# Patient Record
Sex: Female | Born: 1975 | Race: White | Hispanic: No | Marital: Single | Smoking: Never smoker
Health system: Southern US, Community
[De-identification: ages and names within clinical notes are randomized; demographics above are authoritative.]

## PROBLEM LIST (undated history)

## (undated) DIAGNOSIS — F419 Anxiety disorder, unspecified: Secondary | ICD-10-CM

## (undated) HISTORY — DX: Anxiety disorder, unspecified: F41.9

---
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
## 2013-08-17 ENCOUNTER — Ambulatory Visit: Payer: Self-pay | Admitting: Obstetrics and Gynecology

## 2014-08-31 ENCOUNTER — Encounter: Payer: 59 | Admitting: Internal Medicine

## 2014-08-31 NOTE — Progress Notes (Signed)
Pre visit review using our clinic review tool, if applicable. No additional management support is needed unless otherwise documented below in the visit note. 

## 2014-09-19 ENCOUNTER — Ambulatory Visit (INDEPENDENT_AMBULATORY_CARE_PROVIDER_SITE_OTHER): Payer: 59 | Admitting: Psychology

## 2014-09-19 DIAGNOSIS — F332 Major depressive disorder, recurrent severe without psychotic features: Secondary | ICD-10-CM | POA: Diagnosis not present

## 2014-10-10 ENCOUNTER — Ambulatory Visit: Payer: 59 | Admitting: Psychology

## 2016-08-18 ENCOUNTER — Other Ambulatory Visit: Payer: Self-pay | Admitting: Internal Medicine

## 2016-08-18 DIAGNOSIS — Z1231 Encounter for screening mammogram for malignant neoplasm of breast: Secondary | ICD-10-CM

## 2016-09-11 ENCOUNTER — Ambulatory Visit
Admission: RE | Admit: 2016-09-11 | Discharge: 2016-09-11 | Disposition: A | Discharge status: Home / Self Care | Payer: 59 | Source: Ambulatory Visit | Attending: Internal Medicine | Admitting: Internal Medicine

## 2016-09-11 ENCOUNTER — Encounter: Payer: Self-pay | Admitting: Radiology

## 2016-09-11 DIAGNOSIS — Z1231 Encounter for screening mammogram for malignant neoplasm of breast: Secondary | ICD-10-CM | POA: Insufficient documentation

## 2017-07-01 ENCOUNTER — Other Ambulatory Visit: Payer: Self-pay | Admitting: Internal Medicine

## 2017-07-01 DIAGNOSIS — R319 Hematuria, unspecified: Secondary | ICD-10-CM

## 2017-07-03 ENCOUNTER — Ambulatory Visit
Admission: RE | Admit: 2017-07-03 | Discharge: 2017-07-03 | Disposition: A | Discharge status: Home / Self Care | Payer: Managed Care, Other (non HMO) | Source: Ambulatory Visit | Attending: Internal Medicine | Admitting: Internal Medicine

## 2017-07-03 DIAGNOSIS — R319 Hematuria, unspecified: Secondary | ICD-10-CM | POA: Diagnosis present

## 2017-07-09 ENCOUNTER — Other Ambulatory Visit: Payer: Self-pay

## 2017-07-30 ENCOUNTER — Ambulatory Visit: Payer: Managed Care, Other (non HMO) | Admitting: Urology

## 2017-07-30 ENCOUNTER — Encounter: Payer: Self-pay | Admitting: Urology

## 2017-07-30 VITALS — BP 104/64 | HR 77 | Ht 64.0 in | Wt 135.0 lb

## 2017-07-30 DIAGNOSIS — R350 Frequency of micturition: Secondary | ICD-10-CM | POA: Diagnosis not present

## 2017-07-30 DIAGNOSIS — R3129 Other microscopic hematuria: Secondary | ICD-10-CM

## 2017-07-30 LAB — BLADDER SCAN AMB NON-IMAGING

## 2017-07-30 LAB — URINALYSIS, COMPLETE
Bilirubin, UA: NEGATIVE
Glucose, UA: NEGATIVE
Ketones, UA: NEGATIVE
Nitrite, UA: NEGATIVE
PH UA: 7.5 (ref 5.0–7.5)
Protein, UA: NEGATIVE
Specific Gravity, UA: 1.015 (ref 1.005–1.030)
UUROB: 0.2 mg/dL (ref 0.2–1.0)

## 2017-07-30 LAB — MICROSCOPIC EXAMINATION

## 2017-07-30 MED ORDER — SULFAMETHOXAZOLE-TRIMETHOPRIM 800-160 MG PO TABS: 1.0000 | ORAL_TABLET | Freq: Every day | ORAL | 0 refills | Status: DC

## 2017-07-30 NOTE — Progress Notes (Signed)
07/30/2017 2:06 PM   Wendy ParisAlyson N Hatfield February 22, 1976 [id number]  Referring provider: Marguarite ArbourSparks, Jeffrey D, MD [address] [place], [place] [id number]  Chief Complaint  Patient presents with  . Hematuria    New Patient    HPI: Wendy Hatfield is a 42 year old female seen in consultation at the request of Dr. Judithann SheenSparks for evaluation of pyuria and irritative voiding symptoms.  She states on 06/23/2017 she had acute onset of severe urinary urgency, frequency and gross hematuria.  She had an electronic visit and was prescribed Pyridium and a 5-day course of Macrobid.  Her symptoms improved however recurred 5 days after completing her antibiotics.  She saw Dr. Letitia LibraJohnston on 07/01/2017.  Urinalysis showed 10-50 WBC/10-50 RBCs.  A urine culture was not ordered.  A CT scan was ordered however it was unfortunately only a noncontrast CT of the abdomen and pelvis.  This did not show any abnormalities.  She was again treated with a second course of antibiotics with improvement in her symptoms.  Her frequency, urgency again recurred.  She saw Dr. Judithann SheenSparks on 07/14/2017.  Her urinalysis showed 10-50 WBCs with no significant RBCs.  A urine culture was ordered which had no significant growth within the past few days she has had recurrent mild frequency and urgency.  She was treated with a 10-day course of Cipro with resolution of her symptoms.  Prior to onset of the symptoms in early January she denied recurrent urinary tract infections, hematuria or flank or abdominal pain.  Her hematuria has not recurred since the initial episode.  She denies fever, chills.  She denies flank or abdominal pain.  PMH: Past Medical History:  Diagnosis Date  . Anxiety     Surgical History: Past Surgical History:  Procedure Laterality Date  . APPENDECTOMY  1986    Home Medications:  Allergies as of 07/30/2017   No Known Allergies     Medication List        Accurate as of 07/30/17  2:06 PM. Always use your most recent med  list.          ALPRAZolam 0.25 MG tablet Commonly known as:  XANAX alprazolam 0.25 mg tablet   amphetamine-dextroamphetamine 10 MG tablet Commonly known as:  ADDERALL dextroamphetamine-amphetamine 10 mg tablet  Take 1 tablet every day by oral route.   PRENATAL 1 PO Prenatal       Allergies: No Known Allergies  Family History: Family History  Problem Relation Age of Onset  . Bladder Cancer Father     Social History:  reports that  has never smoked. she has never used smokeless tobacco. She reports that she does not drink alcohol or use drugs.  ROS: UROLOGY Frequent Urination?: Yes Hard to postpone urination?: Yes Burning/pain with urination?: Yes Get up at night to urinate?: Yes Leakage of urine?: Yes Urine stream starts and stops?: No Trouble starting stream?: No Do you have to strain to urinate?: No Blood in urine?: Yes Urinary tract infection?: No Sexually transmitted disease?: No Injury to kidneys or bladder?: No Painful intercourse?: No Weak stream?: No Currently pregnant?: No Vaginal bleeding?: No Last menstrual period?: n  Gastrointestinal Nausea?: No Vomiting?: No Indigestion/heartburn?: No Diarrhea?: No Constipation?: No  Constitutional Fever: No Night sweats?: No Weight loss?: No Fatigue?: No  Skin Skin rash/lesions?: No Itching?: No  Eyes Blurred vision?: No Double vision?: No  Ears/Nose/Throat Sore throat?: No Sinus problems?: No  Hematologic/Lymphatic Swollen glands?: No Easy bruising?: No  Cardiovascular Leg swelling?: No Chest pain?:  No  Respiratory Cough?: No Shortness of breath?: No  Endocrine Excessive thirst?: No  Musculoskeletal Back pain?: No Joint pain?: No  Neurological Headaches?: No Dizziness?: No  Psychologic Depression?: No Anxiety?: No  Physical Exam: BP 104/64   Pulse 77   Ht 5\' 4"  (1.626 m)   Wt 135 lb (61.2 kg)   BMI 23.17 kg/m   Constitutional:  Alert and oriented, No acute  distress. HEENT: [place] AT, moist mucus membranes.  Trachea midline, no masses. Cardiovascular: No clubbing, cyanosis, or edema. Respiratory: Normal respiratory effort, no increased work of breathing. GI: Abdomen is soft, nontender, nondistended, no abdominal masses GU: No CVA tenderness. Skin: No rashes, bruises or suspicious lesions. Lymph: No cervical or inguinal adenopathy. Neurologic: Grossly intact, no focal deficits, moving all 4 extremities. Psychiatric: Normal mood and affect.  Laboratory Data:  Urinalysis Microscopy- 6-10 WBC/3-10 RBC/moderate bacteria  Pertinent Imaging: CT images were reviewed   Assessment & Plan:  42 year old female with persistent irritative voiding symptoms which improve with antibiotics.  Persistent pyuria with negative culture.  Urine culture was repeated today.  Will go ahead and start antibiotics pending the culture result.  Have recommended scheduling cystoscopy for further evaluation.  PVR by bladder scan was 10 mL.  1. Microscopic hematuria  - Urinalysis, Complete - BLADDER SCAN AMB NON-IMAGING - CULTURE, URINE COMPREHENSIVE  2. Urinary frequency  - Urinalysis, Complete - BLADDER SCAN AMB NON-IMAGING   Return in about 4 weeks (around 08/27/2017) for Cystoscopy.    Riki Altes, MD  [facility] [address] [place], [place] [address] [phone]

## 2017-08-04 LAB — CULTURE, URINE COMPREHENSIVE

## 2017-08-05 ENCOUNTER — Telehealth: Payer: Self-pay

## 2017-08-05 ENCOUNTER — Encounter (INDEPENDENT_AMBULATORY_CARE_PROVIDER_SITE_OTHER): Payer: Self-pay

## 2017-08-05 NOTE — Telephone Encounter (Signed)
Letter sent.

## 2017-08-05 NOTE — Telephone Encounter (Signed)
-----   Message from Scott C Stoioff, MD sent at 08/05/2017  9:10 AM EST ----- Urine culture was negative for infection 

## 2017-08-27 ENCOUNTER — Ambulatory Visit (INDEPENDENT_AMBULATORY_CARE_PROVIDER_SITE_OTHER): Payer: Managed Care, Other (non HMO) | Admitting: Urology

## 2017-08-27 ENCOUNTER — Encounter: Payer: Self-pay | Admitting: Urology

## 2017-08-27 VITALS — BP 97/60 | HR 59 | Ht 64.0 in | Wt 135.0 lb

## 2017-08-27 DIAGNOSIS — R3129 Other microscopic hematuria: Secondary | ICD-10-CM | POA: Diagnosis not present

## 2017-08-27 MED ORDER — CIPROFLOXACIN HCL 500 MG PO TABS: 500.0000 mg | ORAL_TABLET | Freq: Once | ORAL | Status: AC

## 2017-08-27 MED ORDER — LIDOCAINE HCL 2 % EX GEL: 1.0000 "application " | Freq: Once | CUTANEOUS | Status: AC

## 2017-08-27 NOTE — Progress Notes (Signed)
   08/27/17  CC:  Chief Complaint  Patient presents with  . Cysto    HPI: See note 07/30/2017  Blood pressure 97/60, pulse (!) 59, height 5\' 4"  (1.626 m), weight 135 lb (61.2 kg). NED. A&Ox3.   No respiratory distress   Abd soft, NT, ND Normal external genitalia with patent urethral meatus  Cystoscopy Procedure Note  Patient identification was confirmed, informed consent was obtained, and patient was prepped using Betadine solution.  Lidocaine jelly was administered per urethral meatus.    Preoperative abx where received prior to procedure.    Procedure: - Flexible cystoscope introduced, without any difficulty.   - Thorough search of the bladder revealed:    normal urethral meatus    normal urothelium    no stones    no ulcers     no tumors    no urethral polyps    no trabeculation  - Ureteral orifices were normal in position and appearance.  Post-Procedure: - Patient tolerated the procedure well  Assessment/ Plan: No abnormalities noted on cystoscopy. Urinalysis today was normal.  Her symptoms are improving.  Follow-up 4-6 weeks for symptom reassessment and repeat UA A.   Riki AltesScott C Tamura Lasky, MD

## 2017-08-28 LAB — URINALYSIS, COMPLETE
Bilirubin, UA: NEGATIVE
GLUCOSE, UA: NEGATIVE
Ketones, UA: NEGATIVE
Leukocytes, UA: NEGATIVE
NITRITE UA: NEGATIVE
PH UA: 6 (ref 5.0–7.5)
Protein, UA: NEGATIVE
Specific Gravity, UA: 1.01 (ref 1.005–1.030)
UUROB: 0.2 mg/dL (ref 0.2–1.0)

## 2017-08-28 LAB — MICROSCOPIC EXAMINATION
BACTERIA UA: NONE SEEN
EPITHELIAL CELLS (NON RENAL): NONE SEEN /HPF (ref 0–10)

## 2017-10-01 ENCOUNTER — Encounter: Payer: Self-pay | Admitting: Urology

## 2017-10-01 ENCOUNTER — Ambulatory Visit: Payer: Managed Care, Other (non HMO) | Admitting: Urology

## 2019-02-06 IMAGING — CT CT ABD-PELV W/O CM
3 of 4 series · 10 of 46 positions shown, 17 images · non-contrast
Comparison: None.

CLINICAL DATA: Hematuria

EXAM:
CT ABDOMEN AND PELVIS WITHOUT CONTRAST
TECHNIQUE: Multidetector CT imaging of the abdomen and pelvis was performed
following the standard protocol without IV contrast.

[Series 4: lung bases · axial · 0.65mm/px · z∈[+519,+629]mm · 6 of 32 slices shown, 11 images]
[im 5/32  soft-tissue]
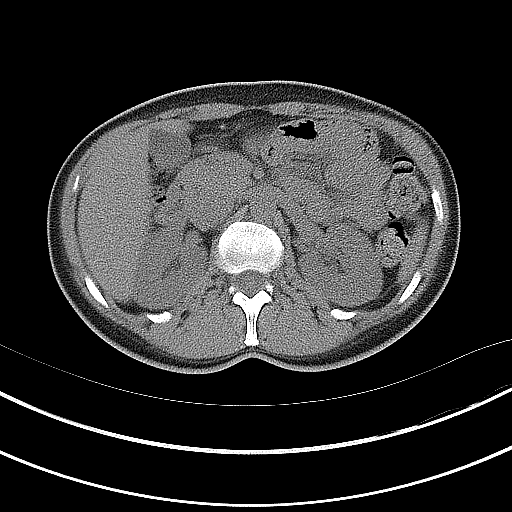
[im 5/32  bone]
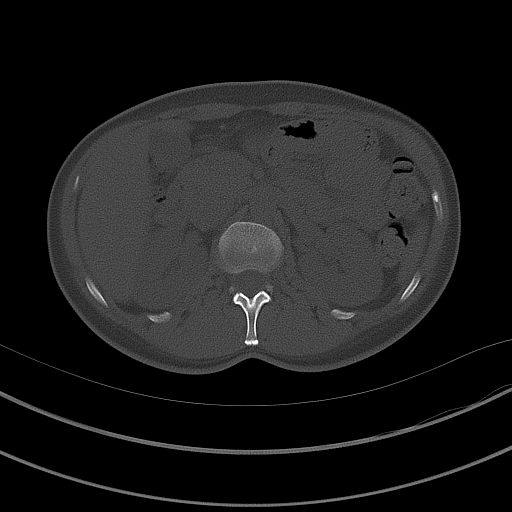
[im 9/32  soft-tissue]
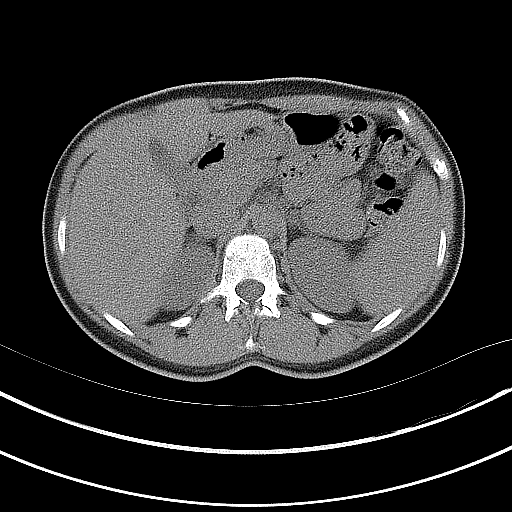
[im 14/32  soft-tissue]
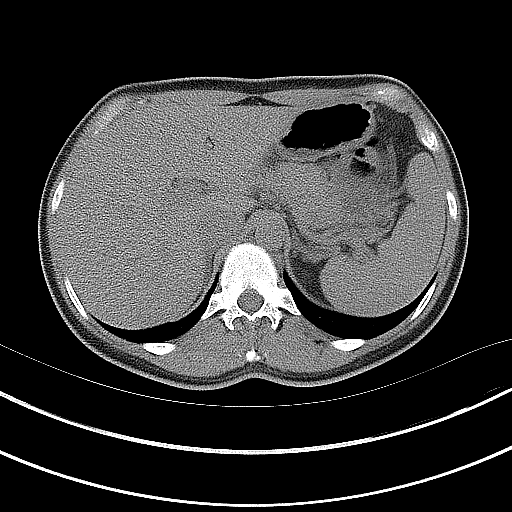
[im 14/32  lung]
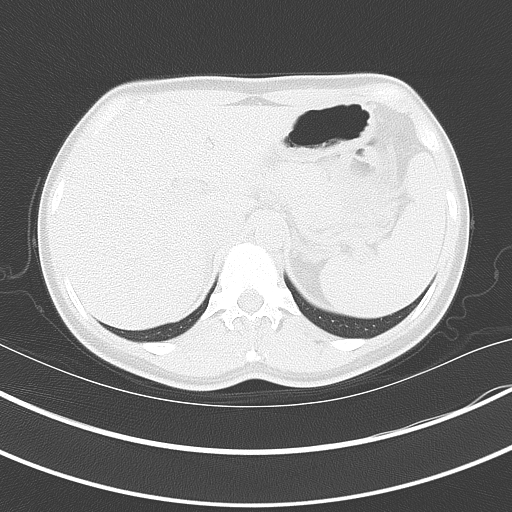
[im 18/32  soft-tissue]
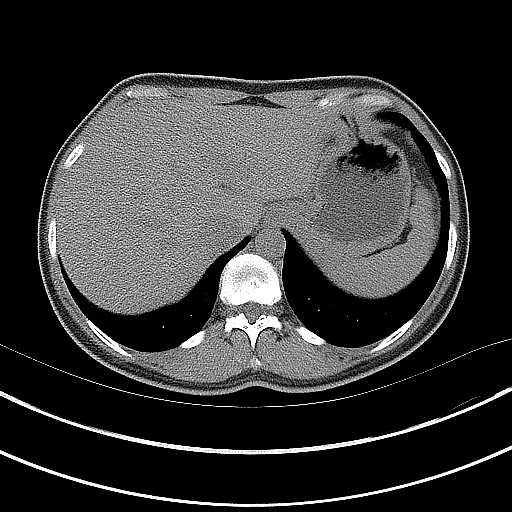
[im 18/32  lung]
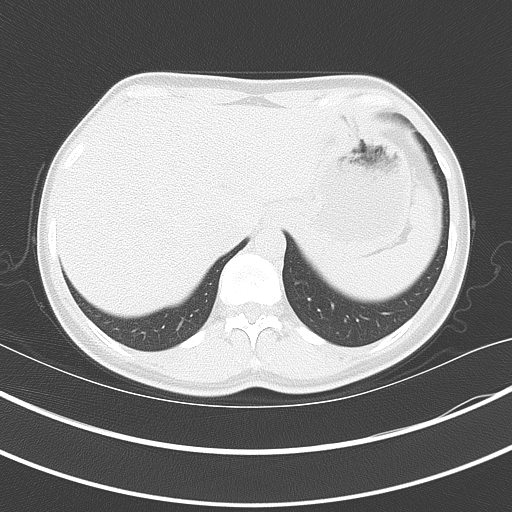
[im 23/32  soft-tissue]
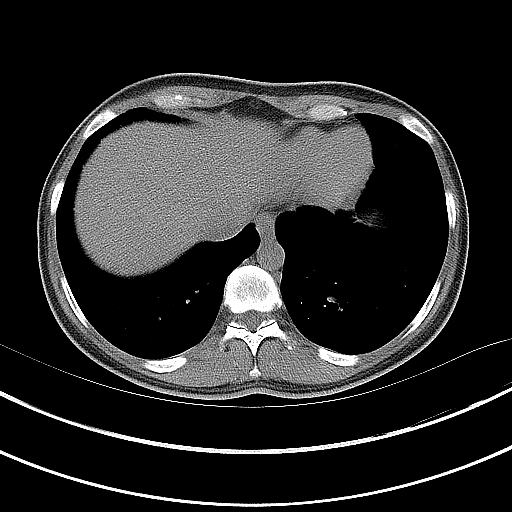
[im 23/32  lung]
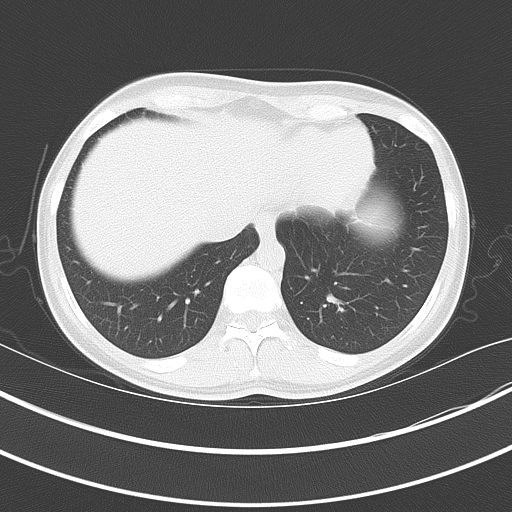
[im 27/32  soft-tissue]
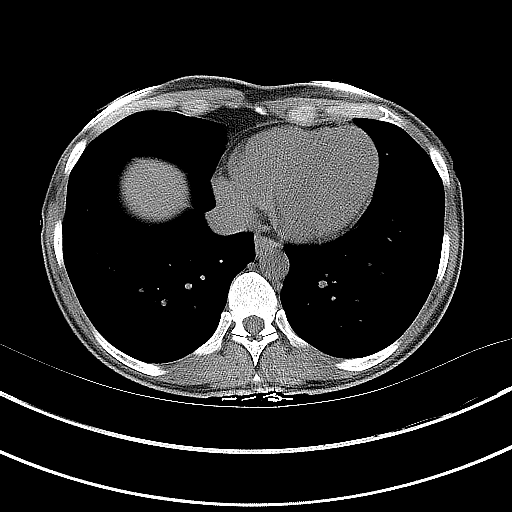
[im 27/32  lung]
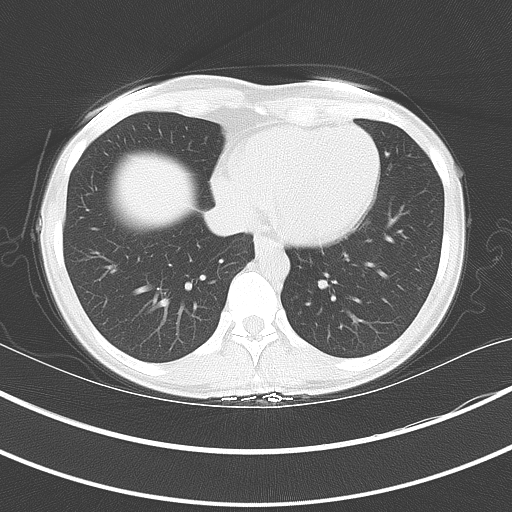

[Series 5: coronal · coronal · 0.75mm/px · 3 of 105 slices shown, 4 images]
[im 35/105  soft-tissue]
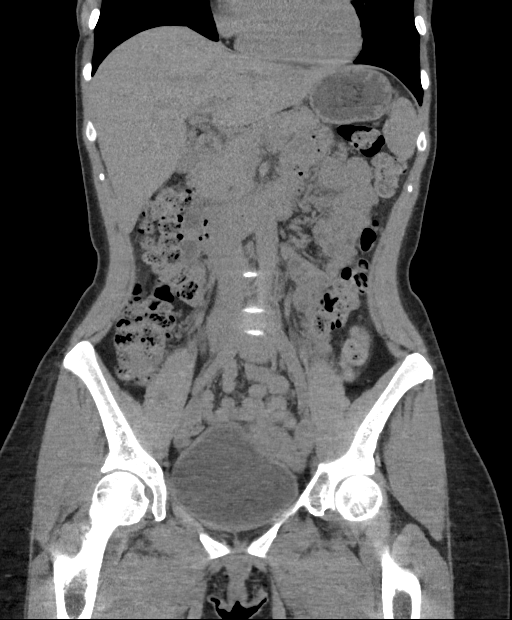
[im 47/105  soft-tissue]
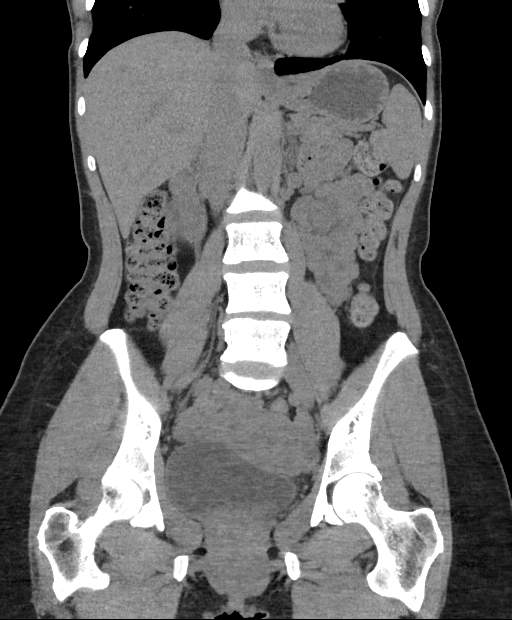
[im 47/105  bone]
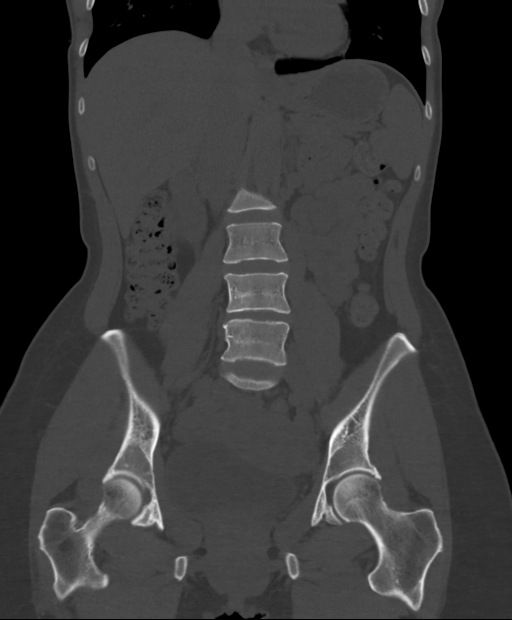
[im 58/105  soft-tissue]
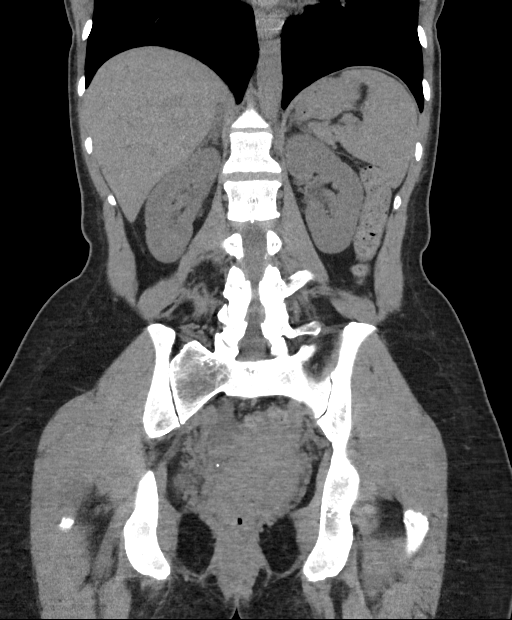

[Series 6: sagittal · sagittal · 0.54mm/px · 1 of 160 slices shown, 2 images]
[im 54/160  soft-tissue]
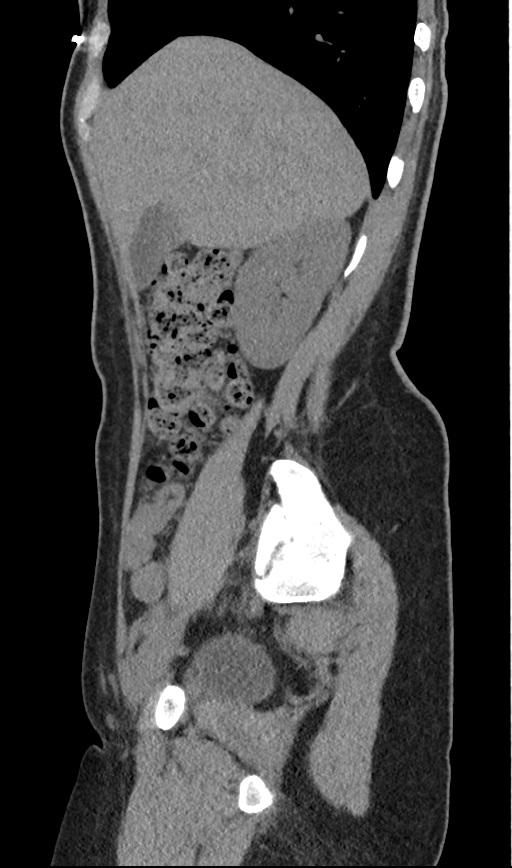
[im 54/160  bone]
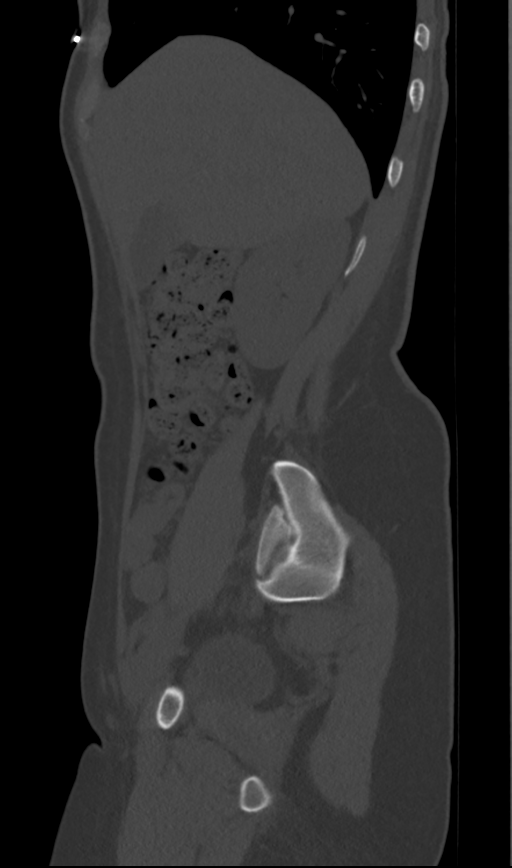

[10 of 46 positions shown; findings below may reference images not displayed]

FINDINGS: Lower chest: Lung bases are free of acute infiltrate or sizable
effusion.

Hepatobiliary: No focal liver abnormality is seen. No gallstones,
gallbladder wall thickening, or biliary dilatation.

Pancreas: Unremarkable. No pancreatic ductal dilatation or
surrounding inflammatory changes.

Spleen: Normal in size without focal abnormality.

Adrenals/Urinary Tract: Adrenal glands are unremarkable. Kidneys are
normal, without renal calculi, focal lesion, or hydronephrosis.
Bladder is unremarkable.

Stomach/Bowel: The appendix is not well visualized. No inflammatory
changes are noted. No obstructive changes are seen.

Vascular/Lymphatic: No significant vascular findings are present. No
enlarged abdominal or pelvic lymph nodes.

Reproductive: Uterus and bilateral adnexa are unremarkable.

Other: No abdominal wall hernia or abnormality. No abdominopelvic
ascites.

Musculoskeletal: No acute or significant osseous findings.
IMPRESSION: No renal calculi or obstructive changes are noted. No acute
abnormality is seen.

## 2022-01-06 ENCOUNTER — Ambulatory Visit: Payer: Managed Care, Other (non HMO) | Admitting: Dermatology

## 2022-01-06 DIAGNOSIS — L650 Telogen effluvium: Secondary | ICD-10-CM

## 2022-01-06 DIAGNOSIS — B360 Pityriasis versicolor: Secondary | ICD-10-CM | POA: Diagnosis not present

## 2022-01-06 MED ORDER — FLUCONAZOLE 200 MG PO TABS: ORAL_TABLET | ORAL | 0 refills | Status: AC

## 2022-01-06 MED ORDER — KETOCONAZOLE 2 % EX SHAM: MEDICATED_SHAMPOO | CUTANEOUS | 3 refills | Status: AC

## 2022-01-06 NOTE — Patient Instructions (Signed)
Due to recent changes in healthcare laws, you may see results of your pathology and/or laboratory studies on MyChart before the doctors have had a chance to review them. We understand that in some cases there may be results that are confusing or concerning to you. Please understand that not all results are received at the same time and often the doctors may need to interpret multiple results in order to provide you with the best plan of care or course of treatment. Therefore, we ask that you please give us 2 business days to thoroughly review all your results before contacting the office for clarification. Should we see a critical lab result, you will be contacted sooner.   If You Need Anything After Your Visit  If you have any questions or concerns for your doctor, please call our main line at [phone] and press option 4 to reach your doctor's medical assistant. If no one answers, please leave a voicemail as directed and we will return your call as soon as possible. Messages left after 4 pm will be answered the following business day.   You may also send us a message via MyChart. We typically respond to MyChart messages within 1-2 business days.  For prescription refills, please ask your pharmacy to contact our office. Our fax number is [phone].  If you have an urgent issue when the clinic is closed that cannot wait until the next business day, you can page your doctor at the number below.    Please note that while we do our best to be available for urgent issues outside of office hours, we are not available 24/7.   If you have an urgent issue and are unable to reach us, you may choose to seek medical care at your doctor's office, retail clinic, urgent care center, or emergency room.  If you have a medical emergency, please immediately call 911 or go to the emergency department.  Pager Numbers  - Dr. Kowalski: [phone]  - Dr. Moye: [phone]  - Dr. Stewart:  [phone]  In the event of inclement weather, please call our main line at [phone] for an update on the status of any delays or closures.  Dermatology Medication Tips: Please keep the boxes that topical medications come in in order to help keep track of the instructions about where and how to use these. Pharmacies typically print the medication instructions only on the boxes and not directly on the medication tubes.   If your medication is too expensive, please contact our office at [phone] option 4 or send us a message through MyChart.   We are unable to tell what your co-pay for medications will be in advance as this is different depending on your insurance coverage. However, we may be able to find a substitute medication at lower cost or fill out paperwork to get insurance to cover a needed medication.   If a prior authorization is required to get your medication covered by your insurance company, please allow us 1-2 business days to complete this process.  Drug prices often vary depending on where the prescription is filled and some pharmacies may offer cheaper prices.  The website www.goodrx.com contains coupons for medications through different pharmacies. The prices here do not account for what the cost may be with help from insurance (it may be cheaper with your insurance), but the website can give you the price if you did not use any insurance.  - You can print the associated coupon and take it with   your prescription to the pharmacy.  - You may also stop by our office during regular business hours and pick up a GoodRx coupon card.  - If you need your prescription sent electronically to a different pharmacy, notify our office through [place] MyChart or by phone at [phone] option 4.     Si Usted Necesita Algo Despus de Su Visita  Tambin puede enviarnos un mensaje a travs de MyChart. Por lo general respondemos a los mensajes de MyChart en el transcurso de 1 a 2  das hbiles.  Para renovar recetas, por favor pida a su farmacia que se ponga en contacto con nuestra oficina. Nuestro nmero de fax es el [phone].  Si tiene un asunto urgente cuando la clnica est cerrada y que no puede esperar hasta el siguiente da hbil, puede llamar/localizar a su doctor(a) al nmero que aparece a continuacin.   Por favor, tenga en cuenta que aunque hacemos todo lo posible para estar disponibles para asuntos urgentes fuera del horario de oficina, no estamos disponibles las 24 horas del da, los 7 das de la semana.   Si tiene un problema urgente y no puede comunicarse con nosotros, puede optar por buscar atencin mdica  en el consultorio de su doctor(a), en una clnica privada, en un centro de atencin urgente o en una sala de emergencias.  Si tiene una emergencia mdica, por favor llame inmediatamente al 911 o vaya a la sala de emergencias.  Nmeros de bper  - Dr. Kowalski: [phone]  - Dra. Moye: [phone]  - Dra. Stewart: [phone]  En caso de inclemencias del tiempo, por favor llame a nuestra lnea principal al [phone] para una actualizacin sobre el estado de cualquier retraso o cierre.  Consejos para la medicacin en dermatologa: Por favor, guarde las cajas en las que vienen los medicamentos de uso tpico para ayudarle a seguir las instrucciones sobre dnde y cmo usarlos. Las farmacias generalmente imprimen las instrucciones del medicamento slo en las cajas y no directamente en los tubos del medicamento.   Si su medicamento es muy caro, por favor, pngase en contacto con nuestra oficina llamando al [phone] y presione la opcin 4 o envenos un mensaje a travs de MyChart.   No podemos decirle cul ser su copago por los medicamentos por adelantado ya que esto es diferente dependiendo de la cobertura de su seguro. Sin embargo, es posible que podamos encontrar un medicamento sustituto a menor costo o llenar un formulario para que el  seguro cubra el medicamento que se considera necesario.   Si se requiere una autorizacin previa para que su compaa de seguros cubra su medicamento, por favor permtanos de 1 a 2 das hbiles para completar este proceso.  Los precios de los medicamentos varan con frecuencia dependiendo del lugar de dnde se surte la receta y alguna farmacias pueden ofrecer precios ms baratos.  El sitio web www.goodrx.com tiene cupones para medicamentos de diferentes farmacias. Los precios aqu no tienen en cuenta lo que podra costar con la ayuda del seguro (puede ser ms barato con su seguro), pero el sitio web puede darle el precio si no utiliz ningn seguro.  - Puede imprimir el cupn correspondiente y llevarlo con su receta a la farmacia.  - Tambin puede pasar por nuestra oficina durante el horario de atencin regular y recoger una tarjeta de cupones de GoodRx.  - Si necesita que su receta se enve electrnicamente a una farmacia diferente, informe a nuestra oficina a travs de MyChart de [place]   o por telfono llamando al [phone] y presione la opcin 4.  

## 2022-01-06 NOTE — Progress Notes (Signed)
New Patient Visit  Subjective  Wendy Hatfield is a 46 y.o. female who presents for the following: Rash  (Ant neck, x 1-2 years - dry scaly patch, uses OTC moisturizers to help improve condition. Fhx of thyroid cancer in sister and PCP was concerned and would like it checked. ), Dry patches (Corner of the mouth - does become discolored at times, brownish in color), Alopecia (Fhx of alopecia areata in sister - significant hair loss over the past couple months, patient losses a lot of hair in the shower, no recent illness but patient has had a lot of stress recently), Tinea versicolor (Chest and back - diagnoses many years ago and was prescribed shampoo and cream but nothing has completely got rid of the rash), and Irregular skin lesion  (On the R lat eyebrow - bleeds if she scratches it. Patient has also noticed bumps on her eyelids that she would like checked today).  The following portions of the chart were reviewed this encounter and updated as appropriate:   Tobacco  Allergies  Meds  Problems  Med Hx  Surg Hx  Fam Hx     Review of Systems:  No other skin or systemic complaints except as noted in HPI or Assessment and Plan.  Objective  Well appearing patient in no apparent distress; mood and affect are within normal limits.  A focused examination was performed including scalp, face, neck. Relevant physical exam findings are noted in the Assessment and Plan.  Scalp Diffuse thinning of hair, positive hair pull test.              Chest, face, back Patches on the chest, back, eyelids, oral commissures   Assessment & Plan  Telogen effluvium Scalp  Telogen effluvium is a benign, self-limited condition causing increased hair shedding usually for several months. It does not progress to baldness, and the hair eventually grows back on its own. It can be triggered by recent illness, recent surgery, thyroid disease, low iron stores, vitamin D deficiency, fad diets or rapid  weight loss, hormonal changes such as pregnancy or birth control pills, and some medication. Usually the hair loss starts 2-3 months after the illness or health change. Rarely, it can continue for longer than a year.  Patient has a lot of stress at work, but no recent illness, or new medications since hair loss began.   Start Biotin 2.5mg  po QD.  Discussed oral Minoxidil but we will need to check patient's labs first to r/o other causes of hair loss.    Tinea versicolor Chest, face, back  (Less likely atopic dermatitis vs contact dermatitis on the face and back) -   Tinea versicolor is a chronic recurrent skin rash causing discolored scaly spots most commonly seen on back, chest, and/or shoulders.  It is generally asymptomatic. The rash is due to overgrowth of a common type of yeast present on everyone's skin and it is not contagious.  It tends to flare more in the summer due to increased sweating on trunk.  After rash is treated, the scaliness will resolve, but the discoloration will take longer to return to normal pigmentation. The periodic use of an OTC medicated soap/shampoo with zinc or selenium sulfide can be helpful to prevent yeast overgrowth and recurrence.  Start Fluconazole 200mg  po QW x 6 wks. Side effects of fluconazole (diflucan) include nausea, diarrhea, headache, dizziness, taste changes, rare risk of irritation of the liver, allergy, or decreased blood counts (which could show up as infection  or tiredness).  Start Ketoconazole 2% shampoo 2-3 days per wk. Let sit 5-10 minutes then wash off.   fluconazole (DIFLUCAN) 200 MG tablet - Chest, face, back Take one tab po QW for 6 weeks. ketoconazole (NIZORAL) 2 % shampoo - Chest, face, back Shampoo onto body let sit 5-10 minutes then wash off. Use 2-3 days per week.  Return for rash follow up in 10-12 wks.  Maylene Roes, CMA, am acting as scribe for Armida Sans, MD . Documentation: I have reviewed the above documentation for  accuracy and completeness, and I agree with the above.  Armida Sans, MD

## 2022-01-15 ENCOUNTER — Encounter: Payer: Self-pay | Admitting: Dermatology

## 2022-01-15 ENCOUNTER — Telehealth: Payer: Self-pay

## 2022-01-15 NOTE — Telephone Encounter (Signed)
Unable to leave a message since her voicemail box is full. If patient wants to start the Minoxidil pill for hair loss we will need screening labs first. I printed the requisition and I can mail it to her or she can come pick it up at our office.

## 2022-01-15 NOTE — Addendum Note (Signed)
Addended by: Cari Caraway A on: 01/15/2022 02:37 PM   Modules accepted: Orders

## 2022-01-20 NOTE — Telephone Encounter (Signed)
Patient states that you were going to discuss starting Minoxidil at follow up appointment. She is scheduled to have labs drawn for her PCP Dr. Judithann Sheen and plans to have that done sometime in the next few weeks.

## 2022-04-07 ENCOUNTER — Ambulatory Visit: Payer: Managed Care, Other (non HMO) | Admitting: Dermatology

## 2023-07-20 ENCOUNTER — Other Ambulatory Visit: Payer: Self-pay | Admitting: Internal Medicine

## 2023-07-20 DIAGNOSIS — Z1231 Encounter for screening mammogram for malignant neoplasm of breast: Secondary | ICD-10-CM

## 2024-03-11 ENCOUNTER — Encounter: Payer: Self-pay | Admitting: Dietician

## 2024-03-11 ENCOUNTER — Encounter: Attending: Obstetrics and Gynecology | Admitting: Dietician

## 2024-03-11 DIAGNOSIS — E663 Overweight: Secondary | ICD-10-CM | POA: Insufficient documentation

## 2024-03-11 DIAGNOSIS — Z713 Dietary counseling and surveillance: Secondary | ICD-10-CM | POA: Diagnosis not present

## 2024-03-11 NOTE — Progress Notes (Signed)
 Medical Nutrition Therapy  Appointment Start time:  1050  Appointment End time:  1220  Primary concerns today: Weight Loss  Referral diagnosis: E66.3 - Overweight Preferred learning style: No preference indicated Learning readiness: Contemplating   NUTRITION ASSESSMENT   Clinical Medical Hx: Overweight Medications: Xanax, Adderall Labs: Reviewed Notable Signs/Symptoms: Unintentional weight gain   Lifestyle & Dietary Hx Pt reports jump in weight over the last 2 years, states they haven't changed their dietary habits, and is unsure as to where this coming from. Pt reports a UBW of 130-135 lbs and is currently around 160 lbs. Pt reports inconsistency with meals, will skip eating some days due to being busy, states they rarely cook at home Pt reports craving sweets more now, eating more convenience foods, snacks a lot during the day/stress eats while working. Pt reports working remote on computer, sedentary during the day    Estimated daily fluid intake: 48 oz Supplements: Vit D Sleep:  Stress / self-care: High, work demand, family demand Current average weekly physical activity: ADLs, walks dog  24-Hr Dietary Recall First Meal: English muffin w/ apple jelly, coffee w/ whole milk Snack:  Second Meal:  Snack:  Third Meal:  Snack:  Beverages: coffee, water   NUTRITION DIAGNOSIS  [place]-3.4 Unintentional weight gain As related to stress.  As evidenced by weight gain of ~30 lbs over UBW in past 2 years, high stress level, emotional eating.   NUTRITION INTERVENTION  Nutrition education (E-1) on the following topics:  Counseled patient on beginning to rebuild their trust in themselves to make the right food choices for their health. Educated patient on mindful eating, including listening to their body's hunger and satiety cues, as well as eating slowly and allowing meals to be more of a sensory experience. Counseled patient on allowing themselves to be present in their emotions when  they consider emotional eating. Advised patient to evaluate whether the impulse to eat is hunger based, or emotionally driven.   Handouts Provided Include  Energy used while exercising  Learning Style & Readiness for Change Teaching method utilized: Visual & Auditory  Demonstrated degree of understanding via: Teach Back  Barriers to learning/adherence to lifestyle change: Stress  Goals Established by Pt Begin to plan on spending the afternoon preparing meals for the week on Sundays. Think about potential obstacles of scheduling that would get in the way of this!  Journal stressful events on days where you find yourself stress eating. Look back at events that happened leading up to when you find yourself stress eating. Bring this log into our next visit!   MONITORING & EVALUATION Dietary intake, weekly physical activity, and stress/work-life balance in 6 weeks.  Next Steps  Patient is to journal stress triggers, follow up with RD.

## 2024-03-11 NOTE — Patient Instructions (Addendum)
 Begin to plan on spending the afternoon preparing meals for the week on Sundays. Think about potential obstacles of scheduling that would get in the way of this!  Journal stressful events on days where you find yourself stress eating. Look back at events that happened leading up to when you find yourself stress eating. Bring this log into our next visit!

## 2024-04-21 ENCOUNTER — Encounter: Attending: Obstetrics and Gynecology | Admitting: Dietician

## 2024-04-21 ENCOUNTER — Encounter: Payer: Self-pay | Admitting: Dietician

## 2024-04-21 DIAGNOSIS — E663 Overweight: Secondary | ICD-10-CM | POA: Insufficient documentation

## 2024-04-21 NOTE — Patient Instructions (Addendum)
 Begin to incorporate resistance exercise 3-5 times per week for 30-60 minutes. Use these exercises to get the most bang for your buck! Squats, Deadlifts, Overhead press, Bench press!  Snack on fruits in place of your chips! Start your day with a small breakfast.

## 2024-04-21 NOTE — Progress Notes (Signed)
 Medical Nutrition Therapy  Appointment Start time:  73  Appointment End time:  1240  Primary concerns today: Weight Loss  Referral diagnosis: E66.3 - Overweight Preferred learning style: No preference indicated Learning readiness: Contemplating   NUTRITION ASSESSMENT   Clinical Medical Hx: Overweight Medications: Alprazolam (PRN) Labs: Reviewed Notable Signs/Symptoms: Unintentional weight gain   Lifestyle & Dietary Hx Pt reports they continue to be very busy, stress level remains high with work, states they have returned to the office 3 days a week now but remains mostly sedentary during work day. Pt reports they have been able think about why they were eating/snacking during the day and attributes it more to boredom than stress.  Pt states they get discouraged with trying to eat healthy/lose weight at times and will eat junkfood out of apathy. Pt reports working on projects at home, states the more organized they get the better that they feel    Estimated daily fluid intake: 48 oz Supplements: Vit D Sleep:  Stress / self-care: High, work demand, family demand Current average weekly physical activity: ADLs, walks dog   24-Hr Dietary Recall First Meal: Cup of coffee w/ whole milk Snack: Salt and vinegar chips, earl grey tea latte Second Meal: Shrimp pasta w/peppers, parmesean Snack: Earl grey tea latte Third Meal:  Snack:  Beverages: coffee, water   NUTRITION DIAGNOSIS  [place]-3.4 Unintentional weight gain As related to stress.  As evidenced by weight gain of ~30 lbs over UBW in past 2 years, high stress level, emotional eating.   NUTRITION INTERVENTION  Nutrition education (E-1) on the following topics:  Counseled patient on beginning to rebuild their trust in themselves to make the right food choices for their health. Educated patient on mindful eating, including listening to their body's hunger and satiety cues, as well as eating slowly and allowing meals to be more  of a sensory experience. Counseled patient on allowing themselves to be present in their emotions when they consider emotional eating. Advised patient to evaluate whether the impulse to eat is hunger based, or emotionally driven.   Handouts Provided Include  Energy used while exercising  Learning Style & Readiness for Change Teaching method utilized: Visual & Auditory  Demonstrated degree of understanding via: Teach Back  Barriers to learning/adherence to lifestyle change: Stress  Goals Established by Pt Begin to incorporate resistance exercise 3-5 times per week for 30-60 minutes. Use these exercises to get the most bang for your buck! Squats, Deadlifts, Overhead press, Bench press! Snack on fruits in place of your chips! Start your day with a small breakfast.   MONITORING & EVALUATION Dietary intake, weekly physical activity, and stress/work-life balance in 6 weeks.  Next Steps  Patient is to journal stress triggers, follow up with RD.

## 2024-06-28 ENCOUNTER — Encounter: Admitting: Dietician
